# Patient Record
Sex: Female | Born: 1965 | Hispanic: Yes | Marital: Married
Health system: Southern US, Community
[De-identification: ages and names within clinical notes are randomized; demographics above are authoritative.]

---

## 2005-02-07 ENCOUNTER — Emergency Department: Payer: Self-pay | Admitting: Unknown Physician Specialty

## 2007-06-12 ENCOUNTER — Other Ambulatory Visit: Payer: Self-pay

## 2007-06-13 ENCOUNTER — Observation Stay: Payer: Self-pay | Admitting: General Surgery

## 2009-02-24 ENCOUNTER — Emergency Department: Payer: Self-pay | Admitting: Emergency Medicine

## 2009-02-24 ENCOUNTER — Ambulatory Visit: Payer: Self-pay | Admitting: Family Medicine

## 2009-03-01 IMAGING — CT CT ABD-PELV W/ CM
1 of 2 series · 15 of 32 positions shown, 19 images · non-contrast
Comparison: none

REASON FOR EXAM: Right lower quadrant abdominal pain
COMMENTS:   LMP: One week ago

PROCEDURE:     CT  - CT ABDOMEN / PELVIS  W  - June 13, 2007  [DATE]
RESULT:     The patient has a history of abdominal pain.
TECHNIQUE: IV and oral contrast enhanced CT of abdomen and pelvis is
obtained.
There are no recent studies available for comparison. Comparison is made to
a prior report of 05/18/2001.

[Series 3: appendicitis · axial · 0.72mm/px · z∈[-1053,-651]mm · 15 of 148 slices shown, 19 images]
[im 7/148  soft-tissue]
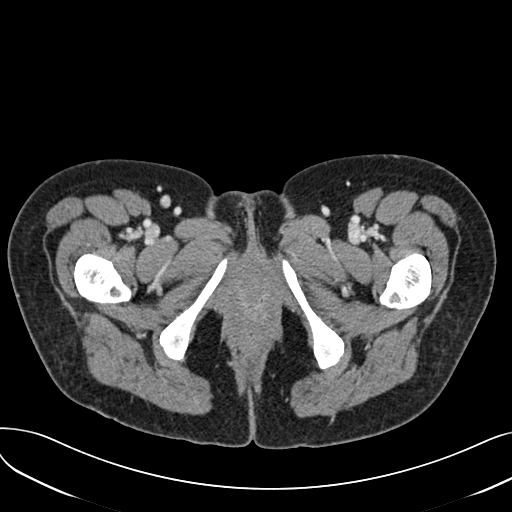
[im 7/148  bone]
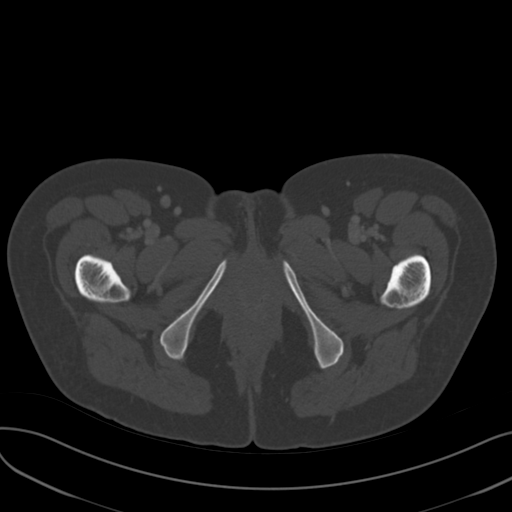
[im 19/148  soft-tissue]
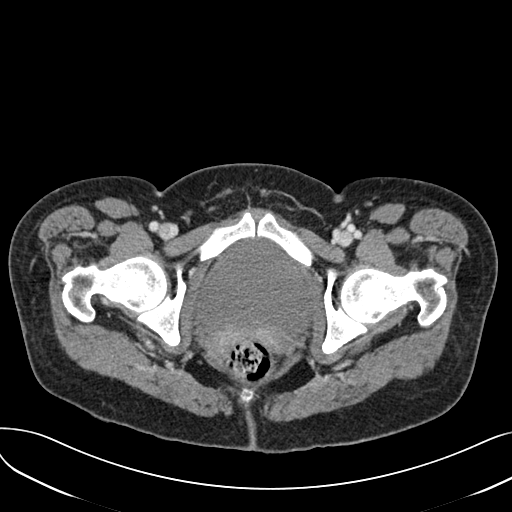
[im 31/148  soft-tissue]
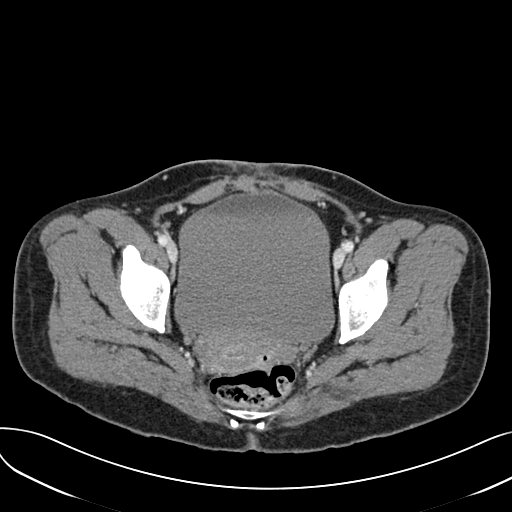
[im 43/148  soft-tissue]
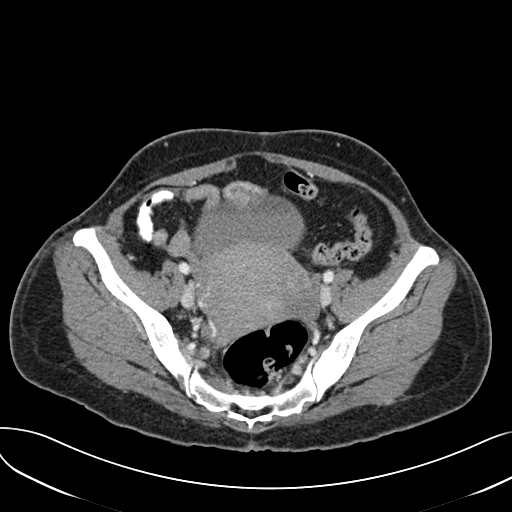
[im 50/148  soft-tissue]
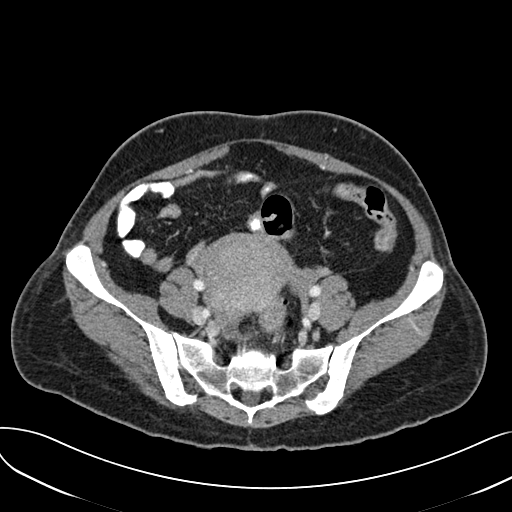
[im 62/148  soft-tissue]
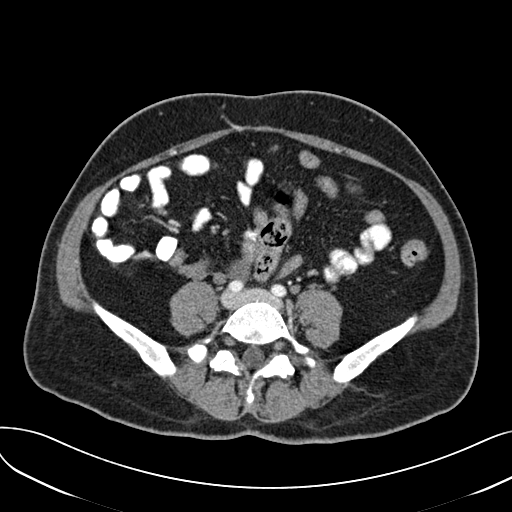
[im 74/148  soft-tissue]
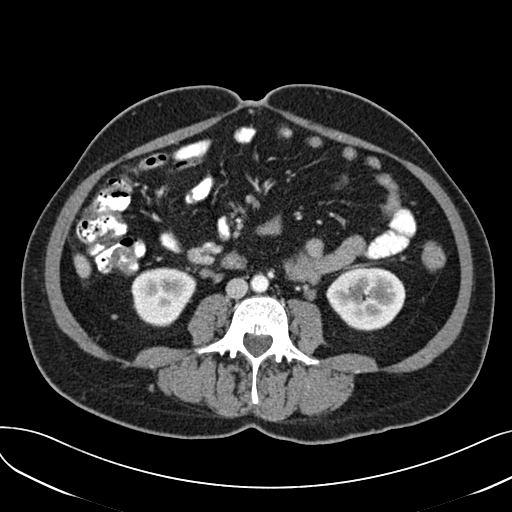
[im 86/148  soft-tissue]
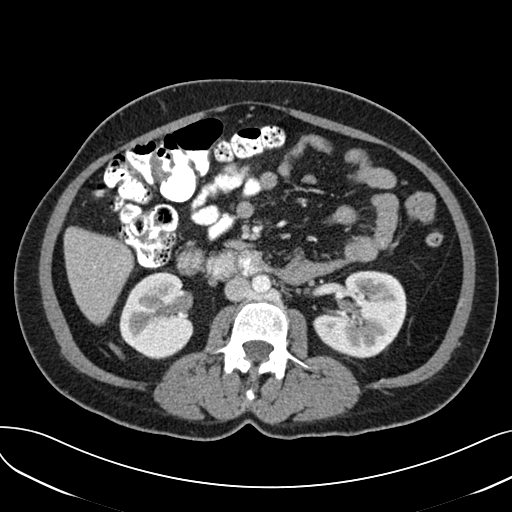
[im 99/148  soft-tissue]
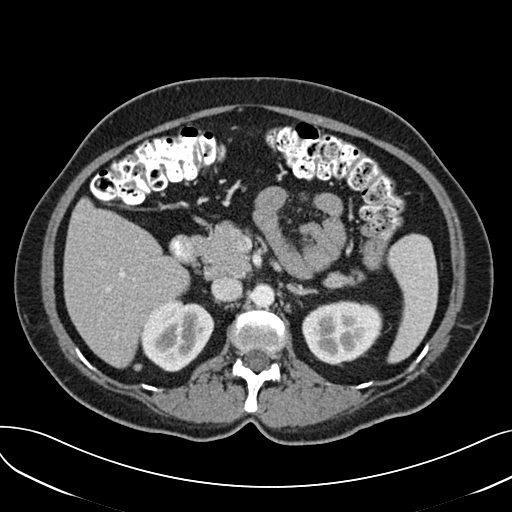
[im 99/148  bone]
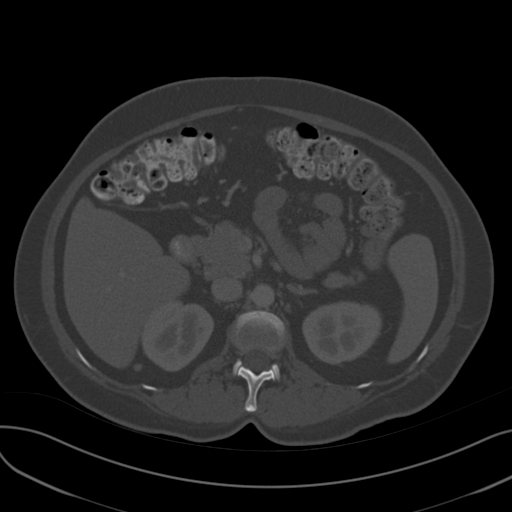
[im 105/148  soft-tissue]
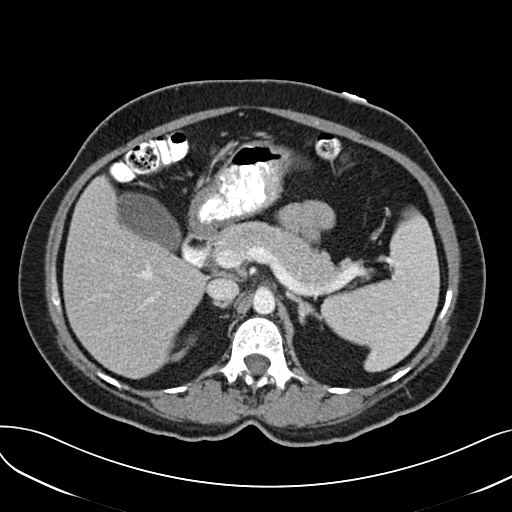
[im 117/148  soft-tissue]
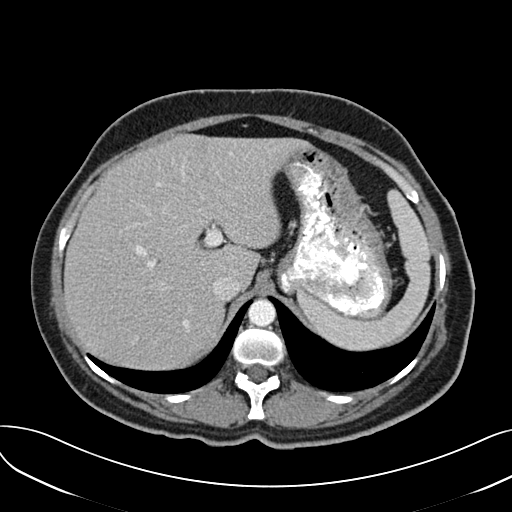
[im 123/148  lung]
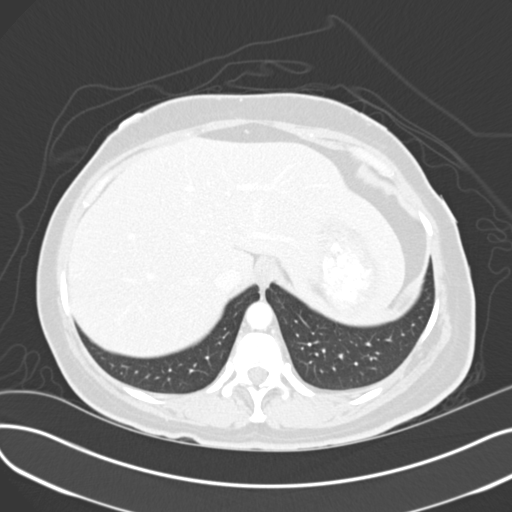
[im 129/148  soft-tissue]
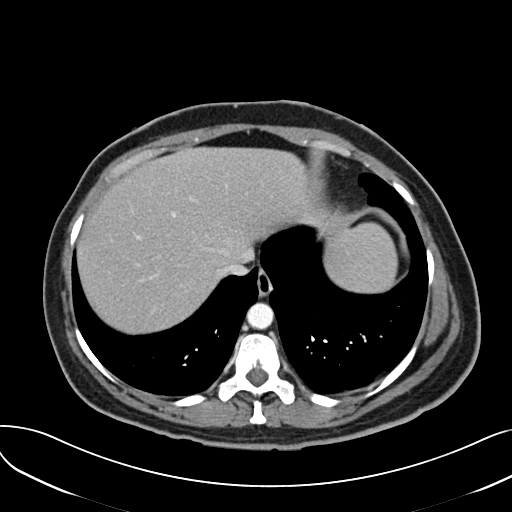
[im 129/148  lung]
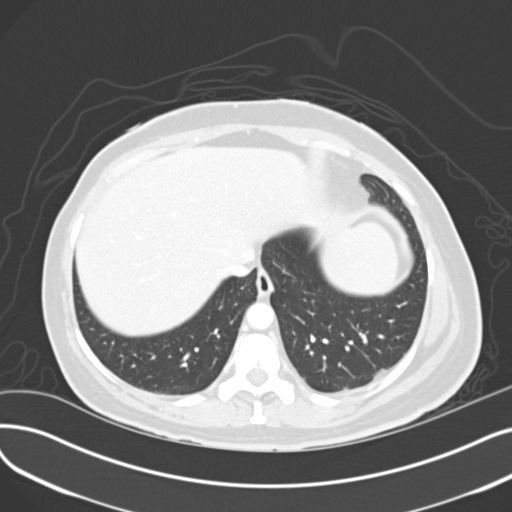
[im 135/148  lung]
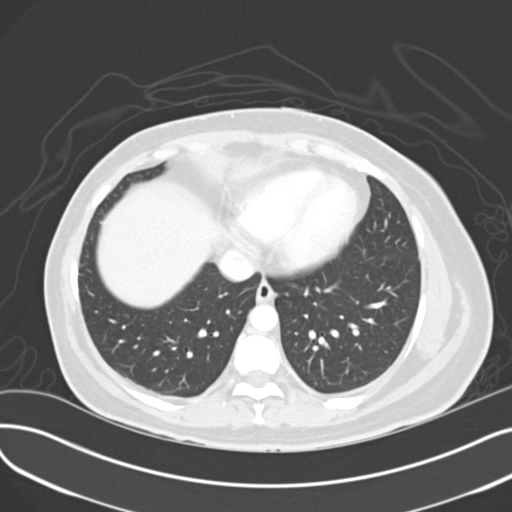
[im 141/148  soft-tissue]
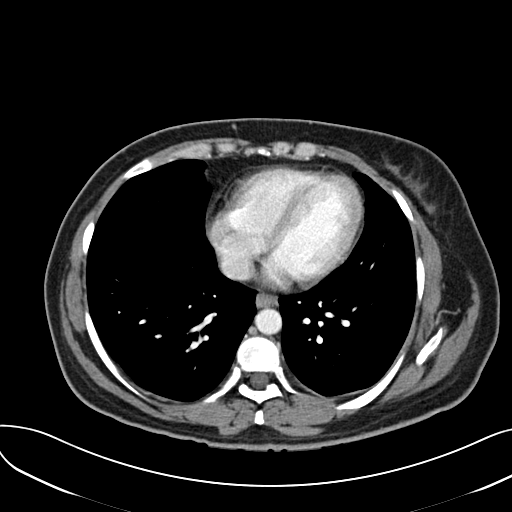
[im 141/148  lung]
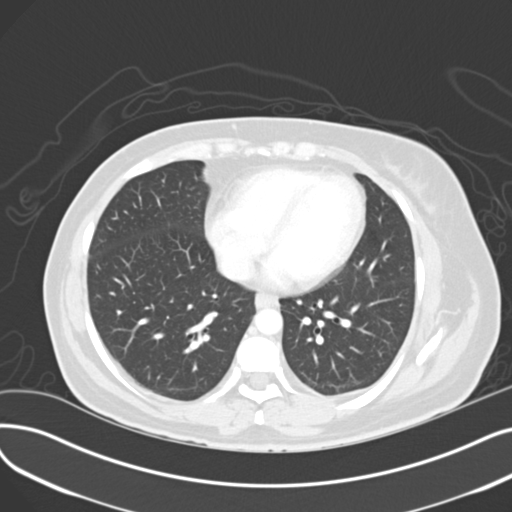

[15 of 32 positions shown; findings below may reference images not displayed]

FINDINGS: The liver is normal. The spleen is normal. The pancreas is normal.
The portal vein and hepatic veins are patent. The adrenals are normal. No
focal renal abnormalities are noted. There is no hydronephrosis. The
abdominal aorta is unremarkable. There is no bowel distention. Minimal fat
plane stranding adjacent to the appendix is noted. The distal appendix is
fluid filled. Very early changes of appendicitis cannot be excluded. There
is no evidence of bowel obstruction. The bladder is unremarkable. A 2.4 cm
cyst is noted in the region of the RIGHT ovary. Small, inguinal lymph nodes
are noted. The lung bases are clear. There is no free air.
IMPRESSION: 1.     Very subtle changes of appendicitis cannot be excluded.
2.     Cyst, RIGHT ovary.

## 2010-11-13 IMAGING — US US PELV - US TRANSVAGINAL
1 series · 17 of 25 positions shown · non-contrast
Comparison: none

REASON FOR EXAM: hx ovarian cysts left adnexal pain  NEEDS SPANISH
INTERPRETER
COMMENTS:

[Series 1: us pelv - us transvaginal · 17 of 67 slices shown]
[im 1/67]
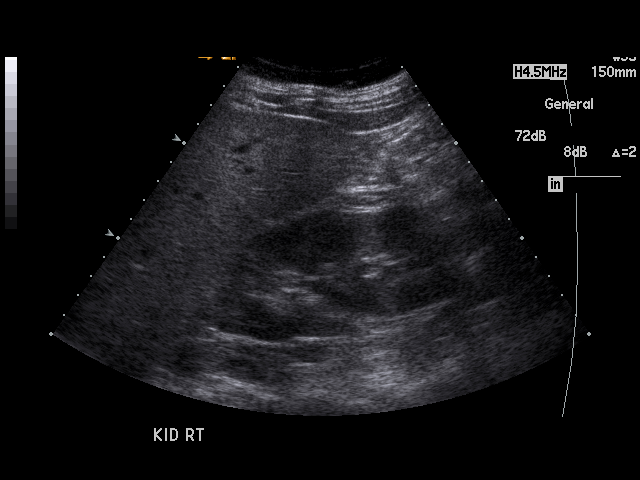
[im 6/67]
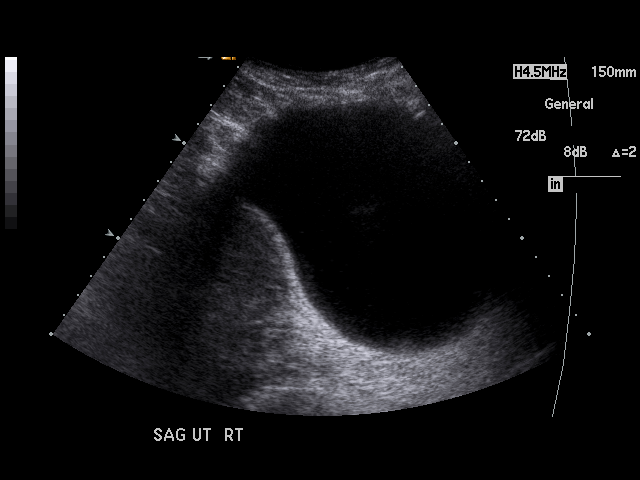
[im 9/67]
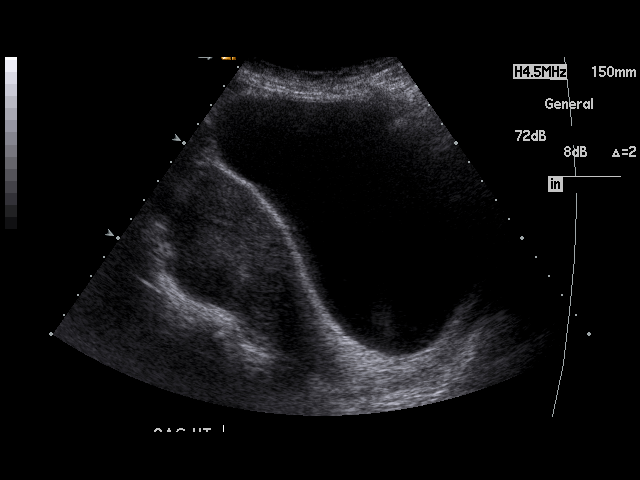
[im 14/67]
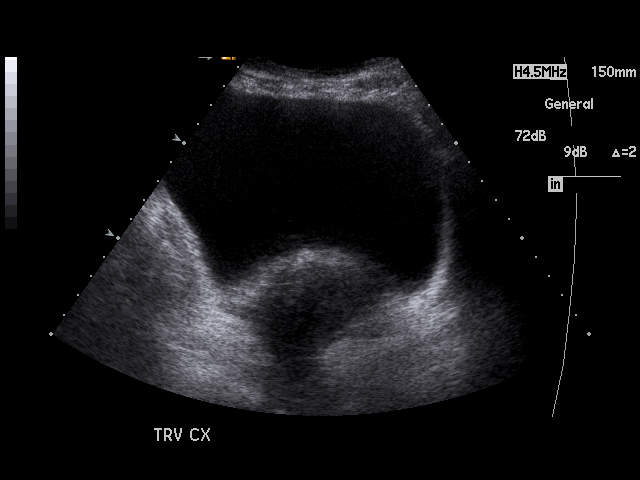
[im 17/67]
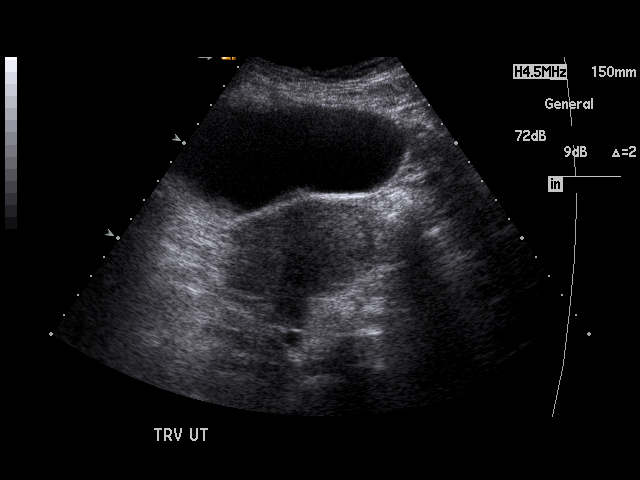
[im 23/67]
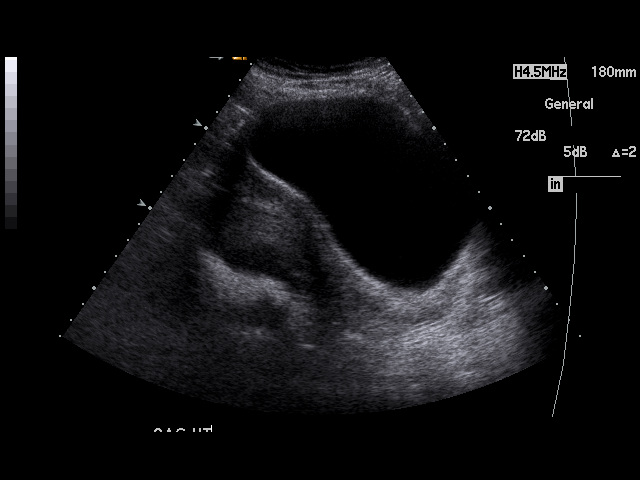
[im 25/67]
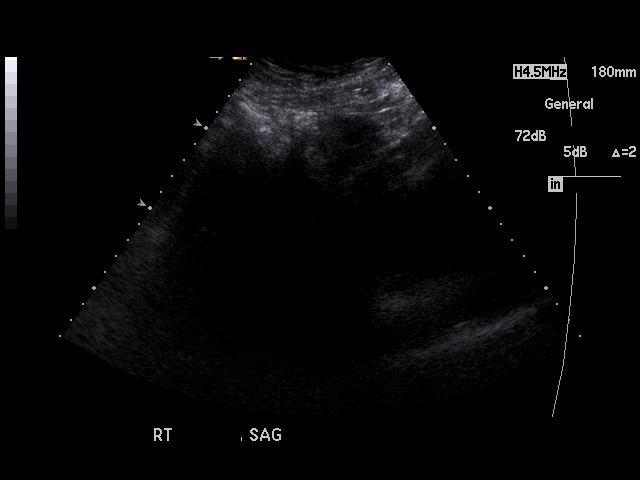
[im 31/67]
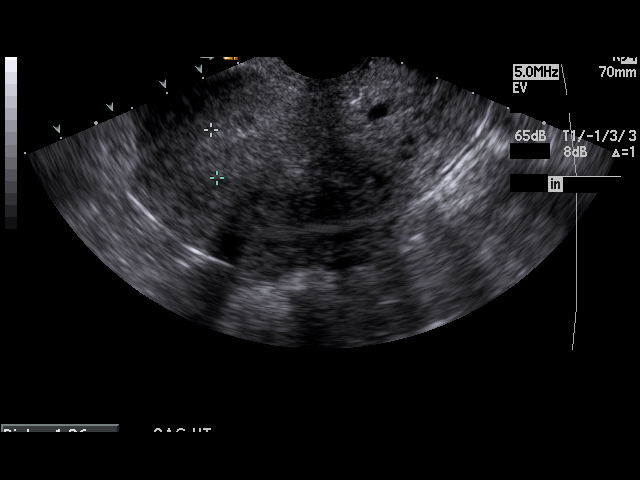
[im 34/67]
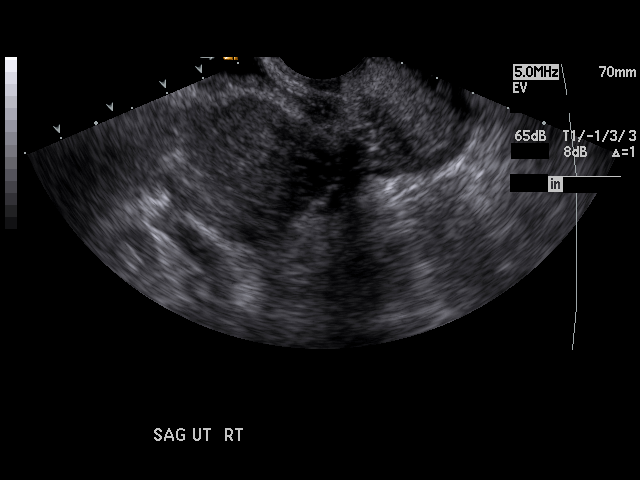
[im 36/67]
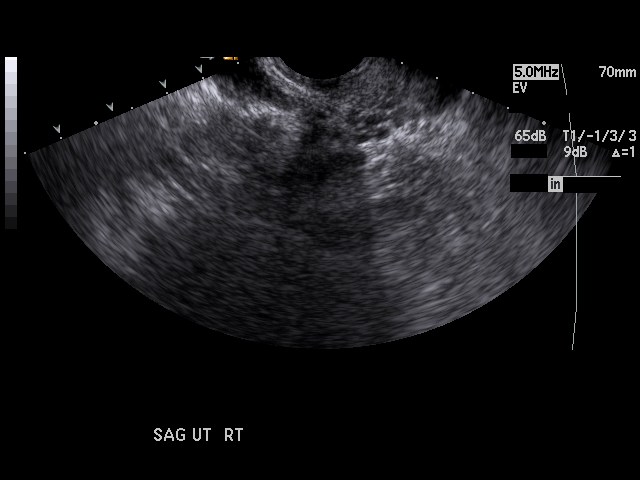
[im 42/67]
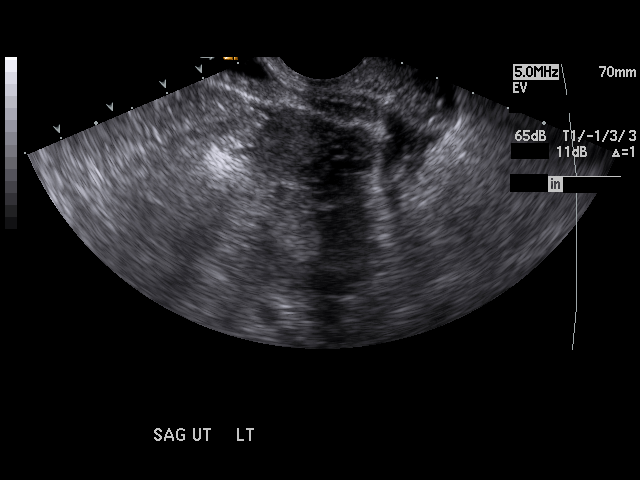
[im 45/67]
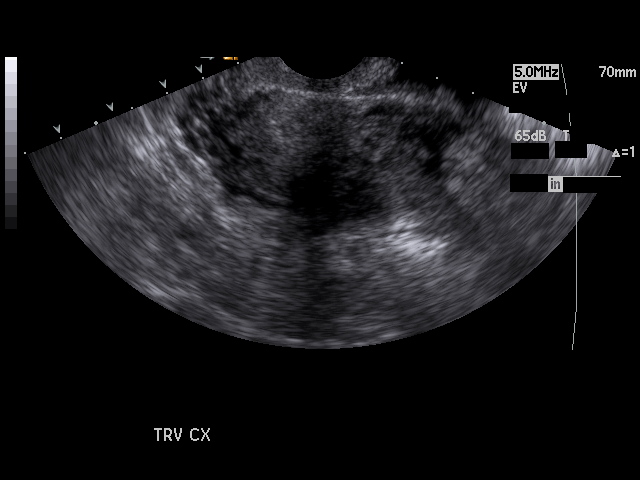
[im 50/67]
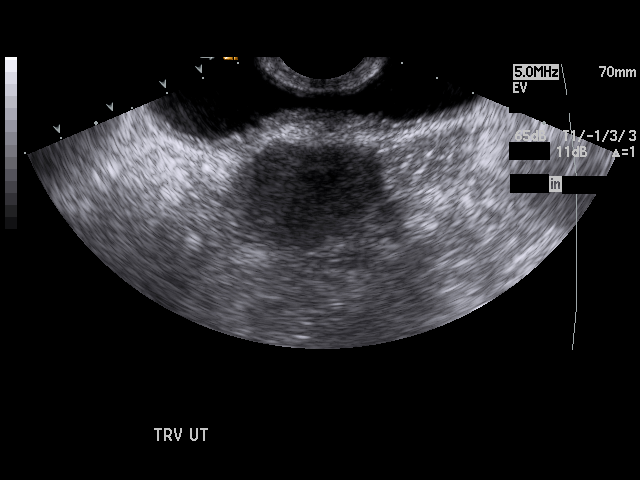
[im 53/67]
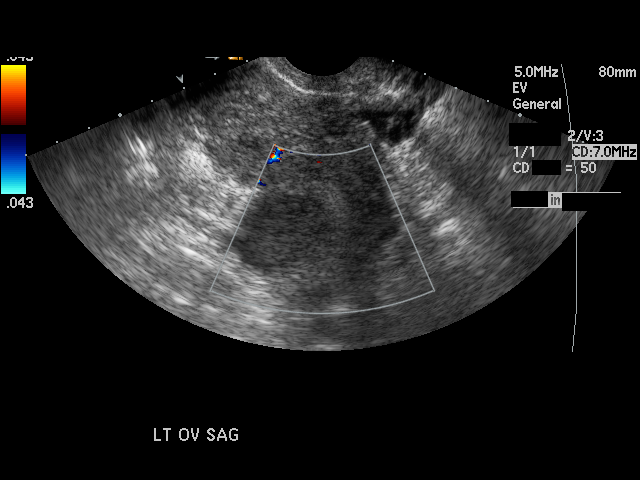
[im 58/67]
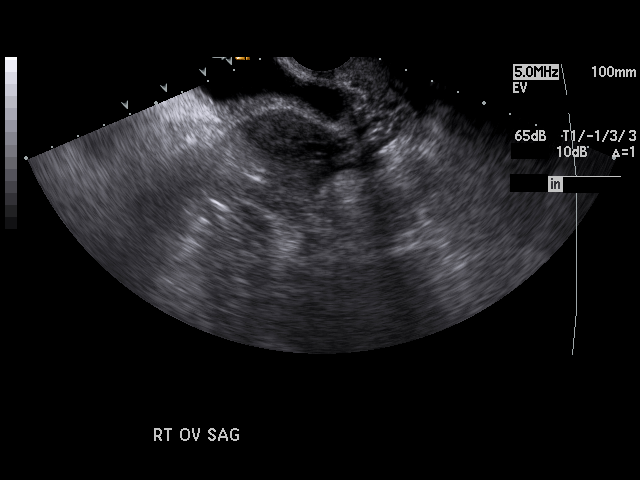
[im 61/67]
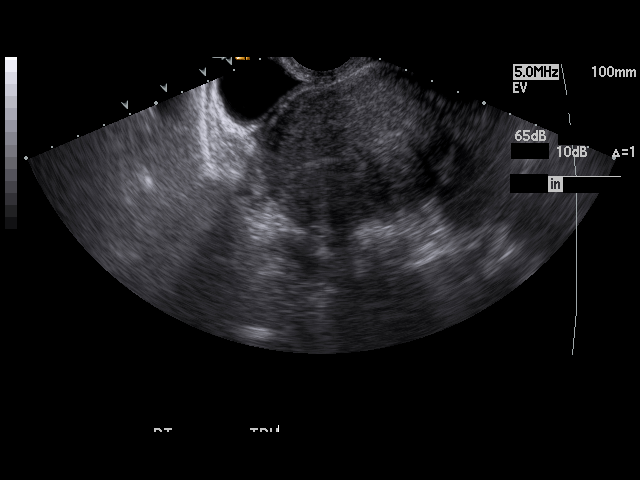
[im 67/67]
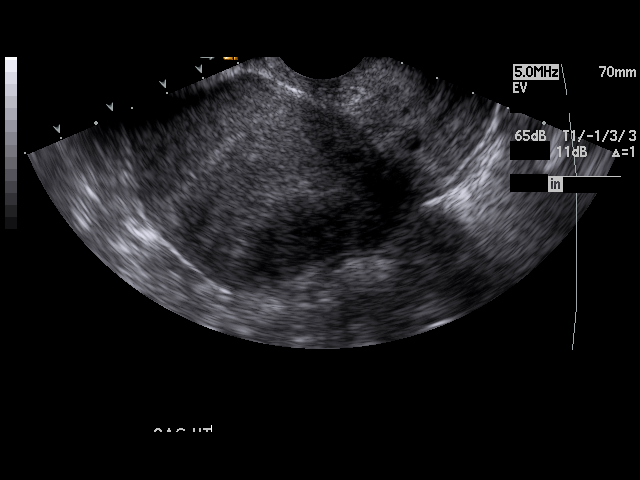

[17 of 25 positions shown; findings below may reference images not displayed]

PROCEDURE:     US  - US PELVIS EXAM W/TRANSVAGINAL  - February 24, 2009  [DATE]

RESULT:     Transabdominal and endovaginal ultrasound was performed. The
uterus measures 11.9 cm x 4.96 cm x 6.96 cm. There is a hypoechoic mass
posteriorly in the lower body of the uterus. The finding is consistent with
a uterine fibroid that measures 2.26 cm at maximum diameter. No other
uterine mass lesions are seen. The endometrium measures 1.26 cm at maximum
thickness. The right ovary is not seen and apparently has been removed, is
atrophic or is obscured. The left ovary is visualized and measures 4.6 cm at
maximum diameter. There is a 2.42 cm cyst of the left ovary. No free fluid
is seen in the pelvis. The kidneys show no hydronephrosis. The visualized
portion of the urinary bladder is normal in appearance.
IMPRESSION: 1. There is a hypoechoic mass in the uterus consistent with a uterine
fibroid that measures 2.26 cm at maximum diameter.
2. The right ovary is not visualized on this exam.
3. The left ovary is seen and measures 4.6 cm at maximum diameter. There is
a 2.42 cm left ovarian cyst.
4. No ascites is observed.
5. The kidneys show no hydronephrosis.

## 2012-02-24 ENCOUNTER — Emergency Department: Payer: Self-pay | Admitting: Emergency Medicine

## 2012-02-25 LAB — BETA STREP CULTURE(ARMC)

## 2018-08-05 ENCOUNTER — Telehealth: Payer: Self-pay

## 2018-08-05 ENCOUNTER — Other Ambulatory Visit: Payer: Self-pay

## 2018-08-05 DIAGNOSIS — Z20822 Contact with and (suspected) exposure to covid-19: Secondary | ICD-10-CM

## 2018-08-05 NOTE — Telephone Encounter (Signed)
Incoming call from Katie Campbell of [facility]. Requesting that Pt.  Be tested for Covid-19.  Call to Interpreter and Interpreter.  Patient scheduled for today 08/05/18 at 245 at [facility] testing site. Patient voices understanding. Katie Campbell

## 2018-08-09 LAB — NOVEL CORONAVIRUS, NAA: SARS-CoV-2, NAA: DETECTED — AB

## 2020-01-26 ENCOUNTER — Ambulatory Visit: Payer: Self-pay

## 2020-02-29 NOTE — Progress Notes (Unsigned)
Patient pre-screened for [facility] eligibility due to COVID 19 precautions. Two patient identifiers used for verification that I was speaking to correct patient.  Rescheduled [facility] appointment for 03/22/20 at 2:30.

## 2020-03-01 ENCOUNTER — Ambulatory Visit: Payer: Self-pay

## 2020-03-22 ENCOUNTER — Ambulatory Visit: Payer: Self-pay | Attending: Oncology

## 2021-07-20 ENCOUNTER — Other Ambulatory Visit: Payer: Self-pay

## 2021-07-20 DIAGNOSIS — Z1231 Encounter for screening mammogram for malignant neoplasm of breast: Secondary | ICD-10-CM

## 2021-07-24 ENCOUNTER — Ambulatory Visit: Payer: Self-pay

## 2021-09-19 ENCOUNTER — Ambulatory Visit: Payer: Self-pay

## 2021-11-02 ENCOUNTER — Other Ambulatory Visit: Payer: Self-pay

## 2021-11-02 DIAGNOSIS — Z1231 Encounter for screening mammogram for malignant neoplasm of breast: Secondary | ICD-10-CM

## 2021-11-06 ENCOUNTER — Ambulatory Visit: Payer: Self-pay | Attending: Hematology and Oncology

## 2021-12-18 ENCOUNTER — Encounter: Payer: Self-pay | Admitting: Family Medicine

## 2022-02-18 ENCOUNTER — Ambulatory Visit: Payer: Self-pay | Attending: Hematology and Oncology

## 2022-02-18 ENCOUNTER — Telehealth: Payer: Self-pay | Admitting: Hematology and Oncology
